# Patient Record
Sex: Female | Born: 1995 | Race: White | Hispanic: No | Marital: Single | Smoking: Never smoker
Health system: Southern US, Community
[De-identification: ages and names within clinical notes are randomized; demographics above are authoritative.]

## PROBLEM LIST (undated history)

## (undated) DIAGNOSIS — N83209 Unspecified ovarian cyst, unspecified side: Secondary | ICD-10-CM

## (undated) HISTORY — PX: KNEE SURGERY: SHX244

---

## 2017-01-24 ENCOUNTER — Encounter: Payer: Self-pay | Admitting: Emergency Medicine

## 2017-01-24 ENCOUNTER — Emergency Department
Admission: EM | Admit: 2017-01-24 | Discharge: 2017-01-24 | Disposition: A | Discharge status: Home / Self Care | Payer: BLUE CROSS/BLUE SHIELD | Attending: Emergency Medicine | Admitting: Emergency Medicine

## 2017-01-24 ENCOUNTER — Emergency Department: Payer: BLUE CROSS/BLUE SHIELD

## 2017-01-24 ENCOUNTER — Other Ambulatory Visit: Payer: Self-pay

## 2017-01-24 DIAGNOSIS — R102 Pelvic and perineal pain: Secondary | ICD-10-CM | POA: Diagnosis not present

## 2017-01-24 DIAGNOSIS — J029 Acute pharyngitis, unspecified: Secondary | ICD-10-CM | POA: Insufficient documentation

## 2017-01-24 DIAGNOSIS — Z975 Presence of (intrauterine) contraceptive device: Secondary | ICD-10-CM | POA: Diagnosis not present

## 2017-01-24 DIAGNOSIS — N760 Acute vaginitis: Secondary | ICD-10-CM | POA: Insufficient documentation

## 2017-01-24 DIAGNOSIS — B9689 Other specified bacterial agents as the cause of diseases classified elsewhere: Secondary | ICD-10-CM

## 2017-01-24 DIAGNOSIS — N938 Other specified abnormal uterine and vaginal bleeding: Secondary | ICD-10-CM | POA: Diagnosis present

## 2017-01-24 DIAGNOSIS — N8312 Corpus luteum cyst of left ovary: Secondary | ICD-10-CM | POA: Insufficient documentation

## 2017-01-24 HISTORY — DX: Unspecified ovarian cyst, unspecified side: N83.209

## 2017-01-24 LAB — CBC WITH DIFFERENTIAL/PLATELET
BASOS ABS: 0 10*3/uL (ref 0–0.1)
Basophils Relative: 0 %
Eosinophils Absolute: 0.1 10*3/uL (ref 0–0.7)
Eosinophils Relative: 1 %
HEMATOCRIT: 42.3 % (ref 35.0–47.0)
HEMOGLOBIN: 14.2 g/dL (ref 12.0–16.0)
LYMPHS PCT: 18 %
Lymphs Abs: 1.2 10*3/uL (ref 1.0–3.6)
MCH: 29.8 pg (ref 26.0–34.0)
MCHC: 33.7 g/dL (ref 32.0–36.0)
MCV: 88.3 fL (ref 80.0–100.0)
MONO ABS: 0.4 10*3/uL (ref 0.2–0.9)
MONOS PCT: 6 %
NEUTROS ABS: 5.2 10*3/uL (ref 1.4–6.5)
Neutrophils Relative %: 75 %
PLATELETS: 228 10*3/uL (ref 150–440)
RBC: 4.79 MIL/uL (ref 3.80–5.20)
RDW: 13.4 % (ref 11.5–14.5)
WBC: 6.9 10*3/uL (ref 3.6–11.0)

## 2017-01-24 LAB — WET PREP, GENITAL
SPERM: NONE SEEN
TRICH WET PREP: NONE SEEN
YEAST WET PREP: NONE SEEN

## 2017-01-24 LAB — BASIC METABOLIC PANEL
ANION GAP: 9 (ref 5–15)
BUN: 13 mg/dL (ref 6–20)
CHLORIDE: 103 mmol/L (ref 101–111)
CO2: 27 mmol/L (ref 22–32)
Calcium: 9.8 mg/dL (ref 8.9–10.3)
Creatinine, Ser: 0.79 mg/dL (ref 0.44–1.00)
GFR calc Af Amer: >60 mL/min (ref >60)
GLUCOSE: 113 mg/dL — AB (ref 65–99)
POTASSIUM: 3.8 mmol/L (ref 3.5–5.1)
Sodium: 139 mmol/L (ref 135–145)

## 2017-01-24 LAB — CHLAMYDIA/NGC RT PCR (ARMC ONLY)
Chlamydia Tr: NOT DETECTED
N gonorrhoeae: NOT DETECTED

## 2017-01-24 LAB — URINALYSIS, COMPLETE (UACMP) WITH MICROSCOPIC
Bacteria, UA: NONE SEEN
Bilirubin Urine: NEGATIVE
GLUCOSE, UA: NEGATIVE mg/dL
KETONES UR: NEGATIVE mg/dL
LEUKOCYTES UA: NEGATIVE
Nitrite: NEGATIVE
PH: 6 (ref 5.0–8.0)
Protein, ur: NEGATIVE mg/dL
SPECIFIC GRAVITY, URINE: 1.02 (ref 1.005–1.030)

## 2017-01-24 LAB — POCT PREGNANCY, URINE: Preg Test, Ur: NEGATIVE

## 2017-01-24 MED ORDER — METRONIDAZOLE 500 MG PO TABS: 500.0000 mg | ORAL_TABLET | Freq: Two times a day (BID) | ORAL | 0 refills | Status: AC

## 2017-01-24 NOTE — Discharge Instructions (Signed)
Keeping your  appointment with your doctor as scheduled. Flagyl 500 mg twice a day for 7 days. Do not drink any alcohol while taking this medication and for 72 hours after finishing it. You may also take Tylenol or Aleve if needed for pain.

## 2017-01-24 NOTE — ED Notes (Signed)
Pt has hx of cyst. Pt has IUD that was placed over a mth ago and has been bleeding since then. Pt has sore throat believes she has mono bc her friend has it. Pt states pain so bad she almost passes out from it. Awaiting EDP.

## 2017-01-24 NOTE — ED Provider Notes (Signed)
[facility] Emergency Department Provider Note  ____________________________________________  Time seen: Approximately 2:08 PM  I have reviewed the triage vital signs and the nursing notes.   HISTORY  Chief Complaint Vaginal Bleeding and Sore Throat    HPI Jacqueline Small is a 21 y.o. female who presents to the emergency department for evaluation of vaginal bleeding and sore throat. She has had vaginal bleeding since her IUD insertion on December 19, 2016. She uses approximately 3 tampons per day. She denies abdominal pain, cramping, or dysuria. Also, she has been exposed to mono and has felt tired, had a sore throat, sinus pressure and nasal congestion for the for the past couple of weeks. She has been on antibiotics, but hasn't gotten any better. She denies fever. She has no chronic medical conditions, she takes no medications on a daily basis, and she has no known drug allergies.  Past Medical History:  Diagnosis Date  . Ovarian cyst     There are no active problems to display for this patient.   Past Surgical History:  Procedure Laterality Date  . KNEE SURGERY Right     Prior to Admission medications   Medication Sig Start Date End Date Taking? Authorizing Provider  metroNIDAZOLE (FLAGYL) 500 MG tablet Take 1 tablet (500 mg total) 2 (two) times daily by mouth. 01/24/17   Tommi RumpsSummers, Rhonda L, PA-C    Allergies Patient has no known allergies.  No family history on file.  Social History Social History   Tobacco Use  . Smoking status: Never Smoker  . Smokeless tobacco: Never Used  Substance Use Topics  . Alcohol use: Yes    Comment: occ  . Drug use: Yes    Types: Marijuana    Review of Systems Constitutional: Negative for fever. Eyes: No visual changes. ENT: Positive for sore throat; negative for difficulty swallowing. Respiratory: Denies shortness of breath. Gastrointestinal: No abdominal pain.  No nausea, no vomiting.  No diarrhea. Positive  for vaginal bleeding. Genitourinary: Negative for dysuria. Musculoskeletal: Negative for generalized body aches. Skin: Negative for rash. Neurological: negative for headaches, negative  focal weakness or numbness.  ____________________________________________   PHYSICAL EXAM:  VITAL SIGNS: ED Triage Vitals [01/24/17 1241]  Enc Vitals Group     BP (!) 137/97     Pulse Rate 98     Resp      Temp 98.4 F (36.9 C)     Temp Source Oral     SpO2 99 %     Weight 160 lb (72.6 kg)     Height 5\' 8"  (1.727 m)     Head Circumference      Peak Flow      Pain Score      Pain Loc      Pain Edu?      Excl. in GC?    Constitutional: Alert and oriented. Well appearing and in no acute distress. Eyes: Conjunctivae are normal.  Head: Atraumatic. Nose: No congestion/rhinnorhea. Mouth/Throat: Mucous membranes are moist.  Oropharynx mildly erythematus, tonsils not visualized and without exudate. Uvula is midline. Neck: No stridor.  Lymphatic: Lymphadenopathy: no cervical or mental nodes palpable Cardiovascular: Normal rate, regular rhythm. Good peripheral circulation. Respiratory: Normal respiratory effort. Lungs CTAB. Gastrointestinal: Soft and nontender. Genitourinary: Pelvic exam: IUD strings identified. No active bleeding. Cervix is normal in appearance. Thin, grey-ish discharge present. Mild adnexal tenderness on the right. Musculoskeletal: No lower extremity tenderness nor edema.  Neurologic:  Normal speech and language. No gross focal neurologic  deficits are appreciated. Speech is normal. No gait instability. Skin:  Skin is warm, dry and intact. No rash noted Psychiatric: Mood and affect are normal. Speech and behavior are normal.  ____________________________________________   LABS (all labs ordered are listed, but only abnormal results are displayed)  Labs Reviewed  WET PREP, GENITAL - Abnormal; Notable for the following components:      Result Value   Clue Cells Wet Prep HPF  POC PRESENT (*)    WBC, Wet Prep HPF POC RARE (*)    All other components within normal limits  BASIC METABOLIC PANEL - Abnormal; Notable for the following components:   Glucose, Bld 113 (*)    All other components within normal limits  URINALYSIS, COMPLETE (UACMP) WITH MICROSCOPIC - Abnormal; Notable for the following components:   Color, Urine YELLOW (*)    APPearance HAZY (*)    Hgb urine dipstick MODERATE (*)    Squamous Epithelial / LPF 0-5 (*)    All other components within normal limits  CHLAMYDIA/NGC RT PCR ([facility] ONLY)  CBC WITH DIFFERENTIAL/PLATELET  POC URINE PREG, ED  POCT PREGNANCY, URINE   ____________________________________________  EKG  Not indicated ____________________________________________  RADIOLOGY  Pelvic Ultrasound: IMPRESSION: 1. IUD in appropriate position within the endometrial cavity. No complication. 2. Probable small left ovarian corpus luteal cyst with small volume free pelvic fluid. 3. Otherwise unremarkable pelvic ultrasound. No other acute abnormality identified. ____________________________________________   PROCEDURES  Procedure(s) performed: Pelvic exam  Critical Care performed: No ____________________________________________   INITIAL IMPRESSION / ASSESSMENT AND PLAN / ED COURSE  21 year old female presenting to the emergency department for evaluation of vaginal bleeding and sore throat. Vaginal bleeding has been consistent since insertion of IUD in October. She has an appointment with her GYN in a couple of weeks, but was advised to come to the ER today as she is an [employer] student and her GYN is not local. Pelvic ultrasound reveals a small ovarian cyst on the left with small amount of free fluid, otherwise normal. URI symptoms/sore throat exam is inconsistent with mononucleosis and this was discussed with the patient who will treat her symptoms with over-the-counter medications. Wet prep and GC/Chlamydia results are  pending. Bridget Hartshornhonda Summers, PA-C will await results and discuss with the patient then make disposition.   Pertinent labs & imaging results that were available during my care of the patient were reviewed by me and considered in my medical decision making (see chart for details). ____________________________________________  This SmartLink is deprecated. Use AVSMEDLIST instead to display the medication list for a patient.  FINAL CLINICAL IMPRESSION(S) / ED DIAGNOSES  Final diagnoses:  Pelvic pain  BV (bacterial vaginosis)  Corpus luteum cyst of left ovary    If controlled substance prescribed during this visit, 12 month history viewed on the [facility] prior to issuing an initial prescription for Schedule II or III opiod.   Note:  This document was prepared using Dragon voice recognition software and may include unintentional dictation errors.    Chinita Pesterriplett, Raechal Raben B, FNP 01/25/17 [id number]    Merrily Brittleifenbark, Neil, MD 01/27/17 [phone]

## 2017-01-24 NOTE — ED Triage Notes (Signed)
Pt to ED via POV for c/o vaginal bleeding since October 6th and sore throat for the past 2 weeks. Pt states that she has hx/o ovarian cyst. Pt has been on antibiotics for URI symptoms but doesn't feel like she is getting any better. Pts friend was recently diagnosed with Mono and pt thinks she may have mono. Pt in NAD at this time.

## 2017-01-26 LAB — POCT PREGNANCY, URINE: PREG TEST UR: NEGATIVE

## 2017-01-28 ENCOUNTER — Encounter: Payer: Self-pay | Admitting: Emergency Medicine

## 2017-01-28 ENCOUNTER — Emergency Department: Payer: BLUE CROSS/BLUE SHIELD

## 2017-01-28 ENCOUNTER — Emergency Department
Admission: EM | Admit: 2017-01-28 | Discharge: 2017-01-28 | Disposition: A | Discharge status: Home / Self Care | Payer: BLUE CROSS/BLUE SHIELD | Attending: Emergency Medicine | Admitting: Emergency Medicine

## 2017-01-28 DIAGNOSIS — J189 Pneumonia, unspecified organism: Secondary | ICD-10-CM

## 2017-01-28 DIAGNOSIS — J181 Lobar pneumonia, unspecified organism: Secondary | ICD-10-CM | POA: Diagnosis not present

## 2017-01-28 DIAGNOSIS — R0602 Shortness of breath: Secondary | ICD-10-CM | POA: Diagnosis present

## 2017-01-28 LAB — BASIC METABOLIC PANEL
ANION GAP: 9 (ref 5–15)
BUN: 14 mg/dL (ref 6–20)
CO2: 26 mmol/L (ref 22–32)
Calcium: 9.7 mg/dL (ref 8.9–10.3)
Chloride: 106 mmol/L (ref 101–111)
Creatinine, Ser: 0.87 mg/dL (ref 0.44–1.00)
GFR calc Af Amer: >60 mL/min (ref >60)
Glucose, Bld: 103 mg/dL — ABNORMAL HIGH (ref 65–99)
POTASSIUM: 3.4 mmol/L — AB (ref 3.5–5.1)
SODIUM: 141 mmol/L (ref 135–145)

## 2017-01-28 LAB — CBC
HEMATOCRIT: 41.5 % (ref 35.0–47.0)
HEMOGLOBIN: 14.2 g/dL (ref 12.0–16.0)
MCH: 29.8 pg (ref 26.0–34.0)
MCHC: 34.1 g/dL (ref 32.0–36.0)
MCV: 87.4 fL (ref 80.0–100.0)
Platelets: 215 10*3/uL (ref 150–440)
RBC: 4.75 MIL/uL (ref 3.80–5.20)
RDW: 13.6 % (ref 11.5–14.5)
WBC: 6.9 10*3/uL (ref 3.6–11.0)

## 2017-01-28 LAB — TROPONIN I: Troponin I: <0.03 ng/mL (ref <0.03)

## 2017-01-28 MED ORDER — IPRATROPIUM-ALBUTEROL 0.5-2.5 (3) MG/3ML IN SOLN
3.0000 mL | Freq: Once | RESPIRATORY_TRACT | Status: AC
  Administered 2017-01-28: 3 mL via RESPIRATORY_TRACT
  Filled 2017-01-28: qty 3

## 2017-01-28 MED ORDER — ALBUTEROL SULFATE HFA 108 (90 BASE) MCG/ACT IN AERS: 2.0000 | INHALATION_SPRAY | Freq: Four times a day (QID) | RESPIRATORY_TRACT | 2 refills | Status: AC | PRN

## 2017-01-28 MED ORDER — AZITHROMYCIN 250 MG PO TABS: ORAL_TABLET | ORAL | 0 refills | Status: AC

## 2017-01-28 MED ORDER — AZITHROMYCIN 500 MG PO TABS
500.0000 mg | ORAL_TABLET | Freq: Once | ORAL | Status: AC
  Administered 2017-01-28: 500 mg via ORAL
  Filled 2017-01-28: qty 1

## 2017-01-28 NOTE — Discharge Instructions (Signed)

## 2017-01-28 NOTE — ED Provider Notes (Signed)
[facility] Emergency Department Provider Note  ____________________________________________  Time seen: Approximately 7:38 AM  I have reviewed the triage vital signs and the nursing notes.   HISTORY  Chief Complaint Shortness of Breath and Chest Pain   HPI Jacqueline Small is a 21 y.o. female no significant past medical history who presents for evaluation of chest pain and shortness of breath. Patient reports 1 week of dry cough. Since yesterday had subjective fevers and wheezing. this morning complaining of chest tightness and mild sob. No prior history of asthma or COPD. Patient is not a smoker. She reports that her chest tightness is mild, constant, and located diffuse across her chest and nonradiating. No pleuritic sharp pain, no leg pain or swelling, no hemoptysis, no family history blood clots. No abdominal pain, no nausea or vomiting. Patient reports that 2 weeks ago she was put on amoxicillin for strep.  Past Medical History:  Diagnosis Date  . Ovarian cyst     There are no active problems to display for this patient.   Past Surgical History:  Procedure Laterality Date  . KNEE SURGERY Right     Prior to Admission medications   Medication Sig Start Date End Date Taking? Authorizing Provider  albuterol (PROVENTIL HFA;VENTOLIN HFA) 108 (90 Base) MCG/ACT inhaler Inhale 2 puffs every 6 (six) hours as needed into the lungs for wheezing or shortness of breath. 01/28/17   Nita SickleVeronese, [place], MD  azithromycin [facility](ZITHROMAX) 250 MG tablet Take 1 a day for 4 days 01/28/17   Don PerkingVeronese, [place], MD  metroNIDAZOLE (FLAGYL) 500 MG tablet Take 1 tablet (500 mg total) 2 (two) times daily by mouth. 01/24/17   Tommi RumpsSummers, Rhonda L, PA-C    Allergies Patient has no known allergies.  History reviewed. No pertinent family history.  Social History Social History   Tobacco Use  . Smoking status: Never Smoker  . Smokeless tobacco: Never Used  Substance Use Topics  .  Alcohol use: Yes    Comment: occ  . Drug use: Yes    Types: Marijuana    Review of Systems  Constitutional: + subjective fever. Eyes: Negative for visual changes. ENT: Negative for sore throat. Neck: No neck pain  Cardiovascular: + chest tightness. Respiratory: + shortness of breath, cough, wheezing Gastrointestinal: Negative for abdominal pain, vomiting or diarrhea. Genitourinary: Negative for dysuria. Musculoskeletal: Negative for back pain. Skin: Negative for rash. Neurological: Negative for headaches, weakness or numbness. Psych: No SI or HI  ____________________________________________   PHYSICAL EXAM:  VITAL SIGNS: ED Triage Vitals [01/28/17 0602]  Enc Vitals Group     BP (!) 118/96     Pulse Rate 89     Resp 18     Temp 98.8 F (37.1 C)     Temp Source Oral     SpO2 100 %     Weight 160 lb (72.6 kg)     Height      Head Circumference      Peak Flow      Pain Score      Pain Loc      Pain Edu?      Excl. in GC?     Constitutional: Alert and oriented. Well appearing and in no apparent distress. HEENT:      Head: Normocephalic and atraumatic.         Eyes: Conjunctivae are normal. Sclera is non-icteric.       Mouth/Throat: Mucous membranes are moist.       Neck: Supple  with no signs of meningismus. Cardiovascular: Regular rate and rhythm. No murmurs, gallops, or rubs. 2+ symmetrical distal pulses are present in all extremities. No JVD. Respiratory: Normal respiratory effort. Lungs are clear to auscultation bilaterally with decrease air movement bilaterally. No wheezes, crackles, or rhonchi.  Gastrointestinal: Soft, non tender, and non distended with positive bowel sounds. No rebound or guarding. Musculoskeletal: Nontender with normal range of motion in all extremities. No edema, cyanosis, or erythema of extremities. Neurologic: Normal speech and language. Face is symmetric. Moving all extremities. No gross focal neurologic deficits are appreciated. Skin:  Skin is warm, dry and intact. No rash noted. Psychiatric: Mood and affect are normal. Speech and behavior are normal.  ____________________________________________   LABS (all labs ordered are listed, but only abnormal results are displayed)  Labs Reviewed  BASIC METABOLIC PANEL - Abnormal; Notable for the following components:      Result Value   Potassium 3.4 (*)    Glucose, Bld 103 (*)    All other components within normal limits  CBC  TROPONIN I   ____________________________________________  EKG  ED ECG REPORT I, Nita Sicklearolina Kailin Leu, the attending physician, personally viewed and interpreted this ECG.  Normal sinus rhythm, rate of 83, normal intervals, normal axis, no ST elevations or depressions. ____________________________________________  RADIOLOGY  CXR:  Apparent mild left basilar opacity is less well characterized on the lateral view but could reflect mild infection. ____________________________________________   PROCEDURES  Procedure(s) performed: None Procedures Critical Care performed:  None ____________________________________________   INITIAL IMPRESSION / ASSESSMENT AND PLAN / ED COURSE  21 y.o. female no significant past medical history who presents for evaluation of chest pain and shortness of breath since this morning in the setting 1 week of cough and intermittent wheezing. Patient is well-appearing, in no distress, normal vital signs, normal work of breathing, afebrile, no tachycardia or tachypnea, lungs are clear although decreased air movement bilaterally with no crackles or wheezes. We'll give him 1 DuoNeb. Chest x-ray concerning for left lower lobe infiltrate. We'll start patient on Z-Pak.     _________________________ 8:15 AM on 01/28/2017 -----------------------------------------  Patient moving great air after one duoneb. Reports resolution of chest tightness and SOB. Labs WNL. Remains well appearing with normal vital signs. At this time  with normal vital signs, normal exam, normal labs I feel the patient is safe for discharge and continue management as an outpatient. Recommended close follow-up with primary care doctor. We'll provide patient with an albuterol inhaler and azithromycin which she was started in the emergency room. Discussed customary and standard return precautions.   As part of my medical decision making, I reviewed the following data within the electronic MEDICAL RECORD NUMBER Nursing notes reviewed and incorporated, Labs reviewed , EKG interpreted , Radiograph reviewed , Notes from prior ED visits and [employer] Controlled Substance Database    Pertinent labs & imaging results that were available during my care of the patient were reviewed by me and considered in my medical decision making (see chart for details).    ____________________________________________   FINAL CLINICAL IMPRESSION(S) / ED DIAGNOSES  Final diagnoses:  Community acquired pneumonia of left lower lobe of lung (HCC)      NEW MEDICATIONS STARTED DURING THIS VISIT:  This SmartLink is deprecated. Use AVSMEDLIST instead to display the medication list for a patient.   Note:  This document was prepared using Dragon voice recognition software and may include unintentional dictation errors.    Nita SickleVeronese, [place], MD 01/28/17 [phone]

## 2017-01-28 NOTE — ED Notes (Signed)
AAOx3.  Skin warm and dry.  NAD 

## 2017-01-28 NOTE — ED Triage Notes (Signed)
Pt c/o central chest pain that woke pt this AM. Pt has experienced cough, nasal and chest congestion x3 weeks. Pt has been taking 2 types of antibiotics but is unaware of what they are. Pt is A&O x4 in triage, dry cough present with auditory wheezing.

## 2017-02-09 ENCOUNTER — Encounter: Payer: Self-pay | Admitting: Emergency Medicine

## 2017-02-09 ENCOUNTER — Emergency Department: Payer: BLUE CROSS/BLUE SHIELD

## 2017-02-09 ENCOUNTER — Other Ambulatory Visit: Payer: Self-pay

## 2017-02-09 ENCOUNTER — Emergency Department
Admission: EM | Admit: 2017-02-09 | Discharge: 2017-02-09 | Disposition: A | Discharge status: Home / Self Care | Payer: BLUE CROSS/BLUE SHIELD | Attending: Emergency Medicine | Admitting: Emergency Medicine

## 2017-02-09 DIAGNOSIS — R109 Unspecified abdominal pain: Secondary | ICD-10-CM | POA: Diagnosis present

## 2017-02-09 DIAGNOSIS — G43909 Migraine, unspecified, not intractable, without status migrainosus: Secondary | ICD-10-CM | POA: Diagnosis not present

## 2017-02-09 LAB — URINALYSIS, ROUTINE W REFLEX MICROSCOPIC
Bilirubin Urine: NEGATIVE
Glucose, UA: NEGATIVE mg/dL
Hgb urine dipstick: NEGATIVE
Ketones, ur: NEGATIVE mg/dL
LEUKOCYTES UA: NEGATIVE
NITRITE: NEGATIVE
Protein, ur: NEGATIVE mg/dL
SPECIFIC GRAVITY, URINE: 1.011 (ref 1.005–1.030)
pH: 5 (ref 5.0–8.0)

## 2017-02-09 LAB — CBC
HEMATOCRIT: 39 % (ref 35.0–47.0)
HEMOGLOBIN: 13.5 g/dL (ref 12.0–16.0)
MCH: 30.2 pg (ref 26.0–34.0)
MCHC: 34.6 g/dL (ref 32.0–36.0)
MCV: 87.2 fL (ref 80.0–100.0)
PLATELETS: 220 10*3/uL (ref 150–440)
RBC: 4.47 MIL/uL (ref 3.80–5.20)
RDW: 13.3 % (ref 11.5–14.5)
WBC: 7.6 10*3/uL (ref 3.6–11.0)

## 2017-02-09 LAB — COMPREHENSIVE METABOLIC PANEL
ALT: 15 U/L (ref 14–54)
ANION GAP: 10 (ref 5–15)
AST: 18 U/L (ref 15–41)
Albumin: 4.3 g/dL (ref 3.5–5.0)
Alkaline Phosphatase: 52 U/L (ref 38–126)
BUN: 14 mg/dL (ref 6–20)
CHLORIDE: 105 mmol/L (ref 101–111)
CO2: 24 mmol/L (ref 22–32)
Calcium: 9.4 mg/dL (ref 8.9–10.3)
Creatinine, Ser: 0.87 mg/dL (ref 0.44–1.00)
GFR calc non Af Amer: >60 mL/min (ref >60)
Glucose, Bld: 101 mg/dL — ABNORMAL HIGH (ref 65–99)
Potassium: 3.6 mmol/L (ref 3.5–5.1)
SODIUM: 139 mmol/L (ref 135–145)
Total Bilirubin: 0.6 mg/dL (ref 0.3–1.2)
Total Protein: 7.3 g/dL (ref 6.5–8.1)

## 2017-02-09 LAB — LIPASE, BLOOD: Lipase: 20 U/L (ref 11–51)

## 2017-02-09 LAB — POCT PREGNANCY, URINE: PREG TEST UR: NEGATIVE

## 2017-02-09 MED ORDER — SODIUM CHLORIDE 0.9 % IV BOLUS (SEPSIS)
1000.0000 mL | Freq: Once | INTRAVENOUS | Status: AC
  Administered 2017-02-09: 1000 mL via INTRAVENOUS

## 2017-02-09 MED ORDER — DIPHENHYDRAMINE HCL 50 MG/ML IJ SOLN
50.0000 mg | Freq: Once | INTRAMUSCULAR | Status: AC
  Administered 2017-02-09: 50 mg via INTRAVENOUS
  Filled 2017-02-09: qty 1

## 2017-02-09 MED ORDER — KETOROLAC TROMETHAMINE 30 MG/ML IJ SOLN
30.0000 mg | Freq: Once | INTRAMUSCULAR | Status: AC
  Administered 2017-02-09: 30 mg via INTRAVENOUS
  Filled 2017-02-09: qty 1

## 2017-02-09 MED ORDER — ONDANSETRON HCL 4 MG/2ML IJ SOLN
INTRAMUSCULAR | Status: AC
  Filled 2017-02-09: qty 2

## 2017-02-09 MED ORDER — METOCLOPRAMIDE HCL 5 MG/ML IJ SOLN
10.0000 mg | Freq: Once | INTRAMUSCULAR | Status: AC
  Administered 2017-02-09: 10 mg via INTRAVENOUS
  Filled 2017-02-09: qty 2

## 2017-02-09 MED ORDER — DICYCLOMINE HCL 20 MG PO TABS: 20.0000 mg | ORAL_TABLET | Freq: Three times a day (TID) | ORAL | 0 refills | Status: AC | PRN

## 2017-02-09 NOTE — ED Triage Notes (Addendum)
Patient ambulatory to triage with steady gait, without difficulty or distress noted; pt reports frontal migraine since last Tuesday; denies hx of same; also c/o right flank and right lower abd pain tonight with no accomp symptoms; imitrex taken PTA with some relief

## 2017-02-09 NOTE — ED Provider Notes (Signed)
[facility] Emergency Department Provider Note  Time seen: 4:48 AM  I have reviewed the triage vital signs and the nursing notes.   HISTORY  Chief Complaint Migraine and Abdominal Pain    HPI Clement SayresZoe Rudie MeyerKurtz is a 21 y.o. female with a past medical history of ovarian cyst presents to the emergency department for significant headache as well as right flank pain.  According to the patient for the past 1 week she has been experiencing a headache.  She states at times it will go away but for the most part it is present.  Denies any history of migraines previously.  Denies any fever.  Denies any weakness numbness difficulty speaking or thinking.  Patient also states for the past 4 days she has intermittently been experiencing right flank pain which she states has gotten somewhat worse.  Denies hematuria or dysuria.  Patient states irregular periods but denies vaginal discharge.   Past Medical History:  Diagnosis Date  . Ovarian cyst     There are no active problems to display for this patient.   Past Surgical History:  Procedure Laterality Date  . KNEE SURGERY Right     Prior to Admission medications   Medication Sig Start Date End Date Taking? Authorizing Provider  albuterol (PROVENTIL HFA;VENTOLIN HFA) 108 (90 Base) MCG/ACT inhaler Inhale 2 puffs every 6 (six) hours as needed into the lungs for wheezing or shortness of breath. 01/28/17   Nita SickleVeronese, [place], MD  azithromycin [facility](ZITHROMAX) 250 MG tablet Take 1 a day for 4 days 01/28/17   Don PerkingVeronese, [place], MD  metroNIDAZOLE (FLAGYL) 500 MG tablet Take 1 tablet (500 mg total) 2 (two) times daily by mouth. 01/24/17   Tommi RumpsSummers, Rhonda L, PA-C    No Known Allergies  No family history on file.  Social History Social History   Tobacco Use  . Smoking status: Never Smoker  . Smokeless tobacco: Never Used  Substance Use Topics  . Alcohol use: Yes    Comment: occ  . Drug use: Yes    Types: Marijuana    Review of  Systems Constitutional: Negative for fever. Cardiovascular: Negative for chest pain. Respiratory: Negative for shortness of breath. Gastrointestinal: Right flank pain.  Negative for nausea or vomiting.  Negative for diarrhea. Genitourinary: Negative for dysuria.  Negative for hematuria. Musculoskeletal: Negative for back pain Neurological: Significant headache but denies focal weakness or numbness. All other ROS negative  ____________________________________________   PHYSICAL EXAM:  VITAL SIGNS: ED Triage Vitals  Enc Vitals Group     BP 02/09/17 0418 (!) 144/106     Pulse Rate 02/09/17 0418 99     Resp 02/09/17 0418 18     Temp 02/09/17 0418 (!) 97.4 F (36.3 C)     Temp src --      SpO2 02/09/17 0418 100 %     Weight 02/09/17 0413 160 lb (72.6 kg)     Height 02/09/17 0413 5\' 8"  (1.727 m)     Head Circumference --      Peak Flow --      Pain Score 02/09/17 0412 8     Pain Loc --      Pain Edu? --      Excl. in GC? --    Constitutional: Alert and oriented. Well appearing and in no distress. Eyes: Normal exam ENT   Head: Normocephalic and atraumatic.   Mouth/Throat: Mucous membranes are moist. Cardiovascular: Normal rate, regular rhythm. No murmur Respiratory: Normal respiratory effort without tachypnea  nor retractions. Breath sounds are clear  Gastrointestinal: Soft, mild right mid tenderness to palpation, no rebound or guarding.  No distention.  No CVA tenderness. Musculoskeletal: Nontender with normal range of motion in all extremities. Neurologic:  Normal speech and language. No gross focal neurologic deficits.  Equal grip strength.  No pronator drift.  Cranial nerves intact. Skin:  Skin is warm, dry and intact.  Psychiatric: Mood and affect are normal.   ____________________________________________   RADIOLOGY  ct head Negative Renal ultrasound negative  ____________________________________________   INITIAL IMPRESSION / ASSESSMENT AND PLAN / ED  COURSE  Pertinent labs & imaging results that were available during my care of the patient were reviewed by me and considered in my medical decision making (see chart for details).  Patient presents the emergency department with complaints of 1 week of headache which she describes as severe but no history of migraines previously.  She also states several days of right flank pain which has gotten worse.  No history of kidney stones.  Patient does state a history of ovarian cyst in the past.  Differential would include migraine headache, tension headache, less likely ICH.  Differential for abdominal pain would include biliary disease, appendicitis, ovarian cyst, ureterolithiasis, intestinal pain.  We will check labs, obtain a CT scan of the head given no history of headaches and obtain an ultrasound of the kidneys to help evaluate for possible kidney stone.  Overall the patient appears extremely well, no distress.  I reviewed the patient's records including 2 recent ER visits over the past 2 weeks for various complaints, largely noncontributory to today's ER visit.  Patient states she is feeling better after medications.  Patient's workup is normal, labs are normal, renal ultrasound and CT scan of the head are normal.  I discussed with patient trial of Bentyl for her abdominal discomfort and follow-up with her primary care doctor. ____________________________________________   FINAL CLINICAL IMPRESSION(S) / ED DIAGNOSES  Headache Abdominal pain    Minna AntisPaduchowski, Maribelle Hopple, MD 02/09/17 [phone]

## 2017-02-09 NOTE — ED Notes (Signed)
Patient transported to CT 

## 2018-03-07 IMAGING — CT CT HEAD W/O CM
3 series · 15 of 47 positions shown, 18 images · non-contrast
Comparison: None.

CLINICAL DATA: Frontal headache

EXAM:
CT HEAD WITHOUT CONTRAST
TECHNIQUE: Contiguous axial images were obtained from the base of the skull
through the vertex without intravenous contrast.

[Series 3: head wo · axial · 0.43mm/px · z∈[-121,+4]mm · 9 of 30 slices shown, 12 images]
[im 3/30  brain]
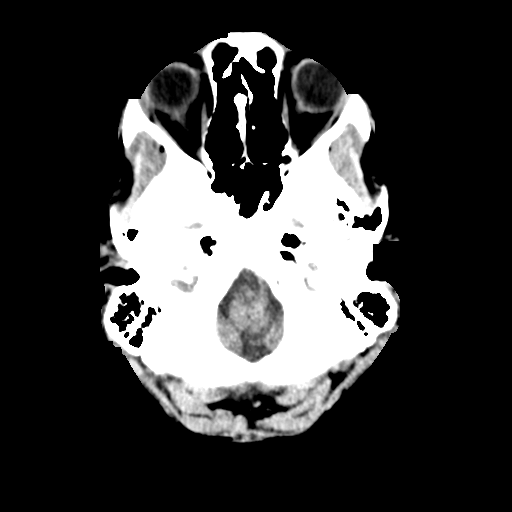
[im 3/30  bone]
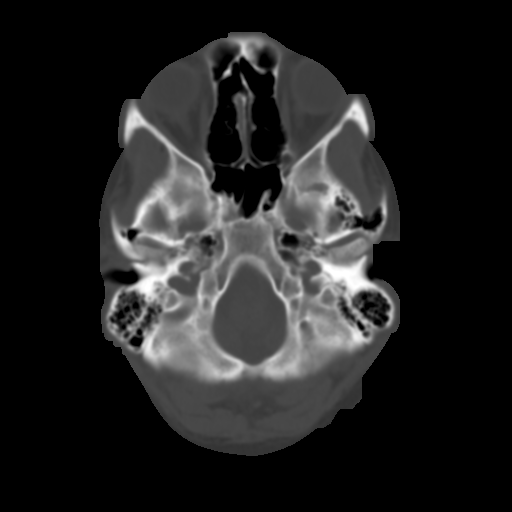
[im 6/30  brain]
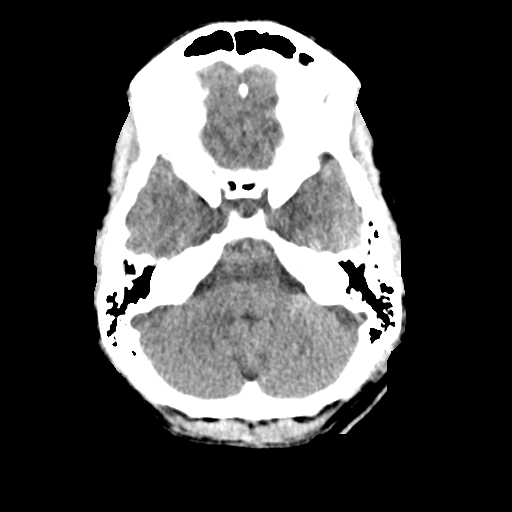
[im 9/30  brain]
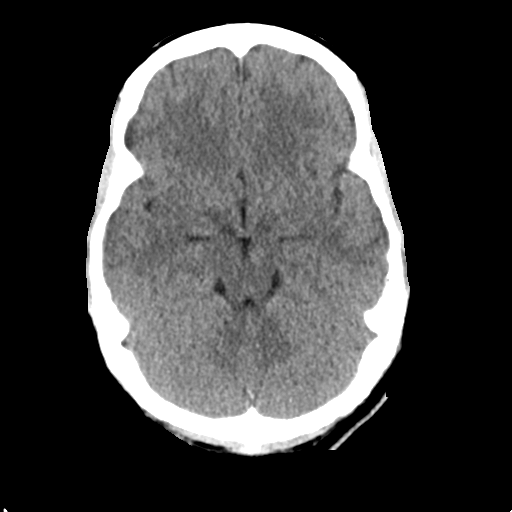
[im 12/30  brain]
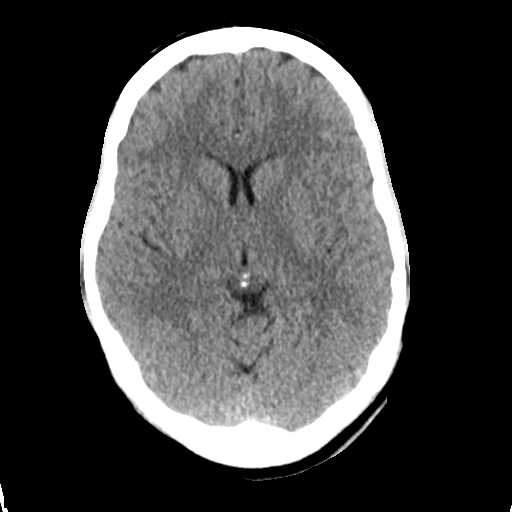
[im 16/30  brain]
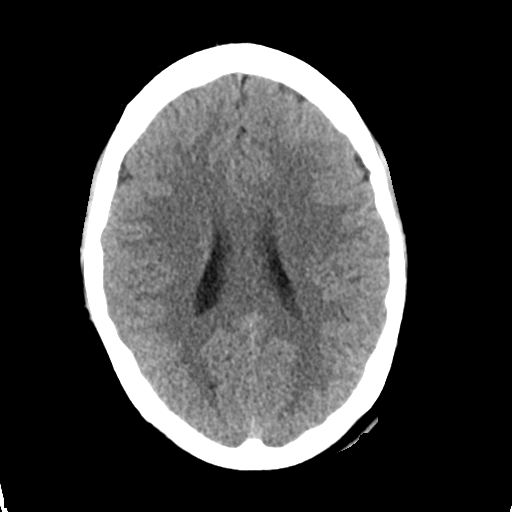
[im 16/30  bone]
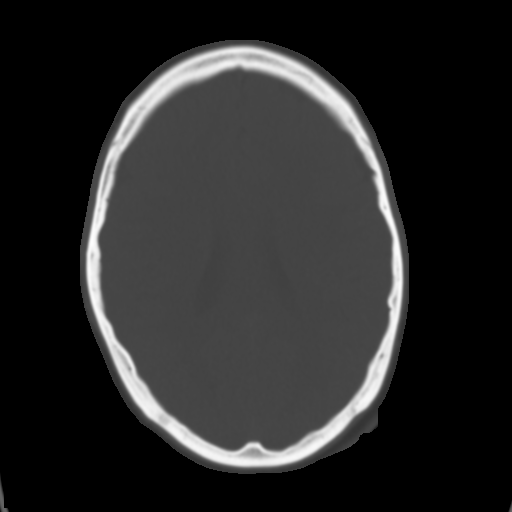
[im 19/30  brain]
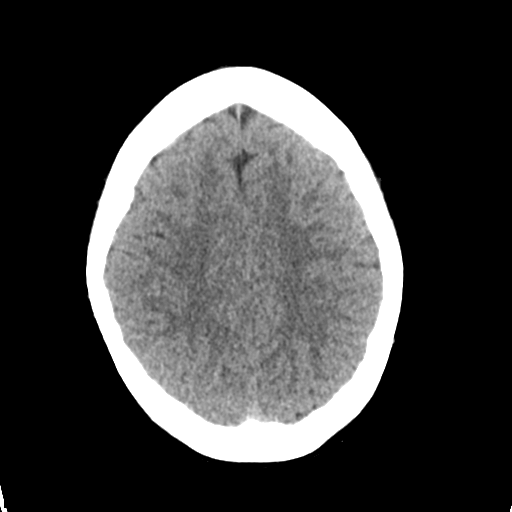
[im 22/30  brain]
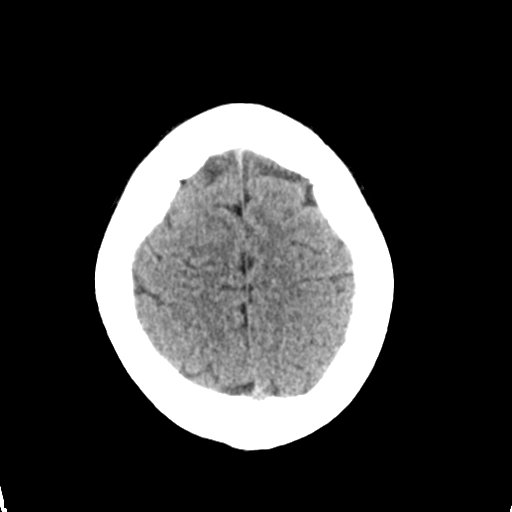
[im 25/30  brain]
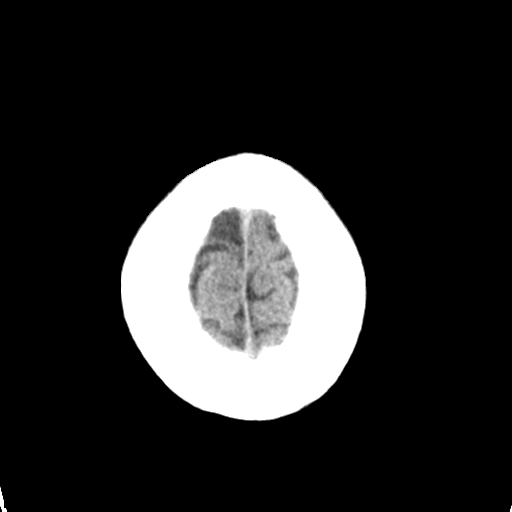
[im 28/30  brain]
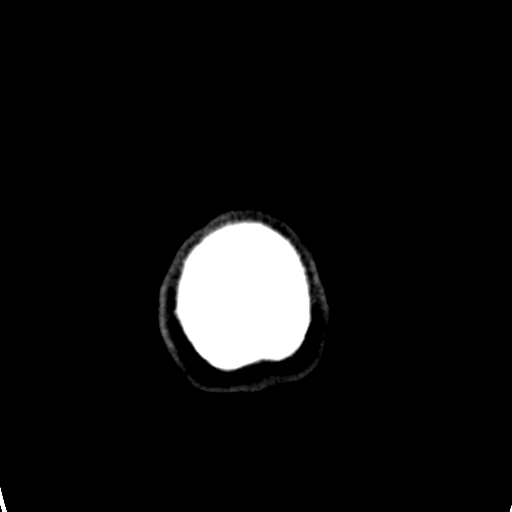
[im 28/30  bone]
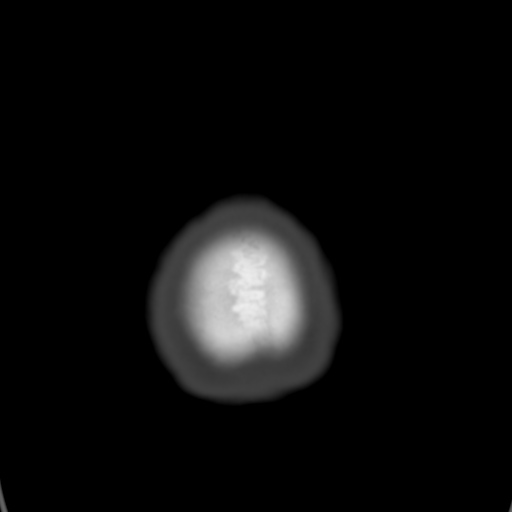

[Series 4: coronal soft tissue · coronal · 0.29mm/px · 3 of 69 slices shown]
[im 23/69  brain]
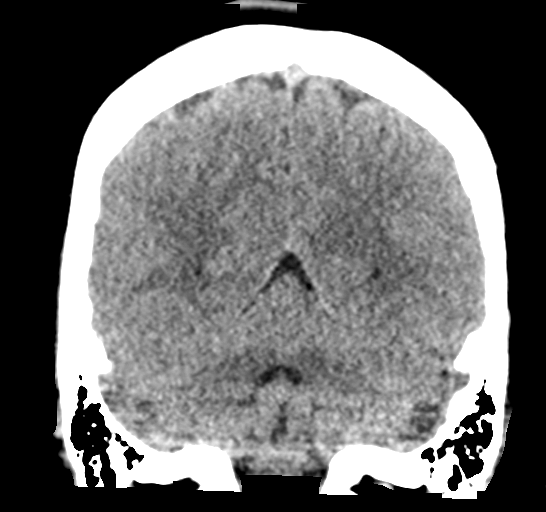
[im 31/69  brain]
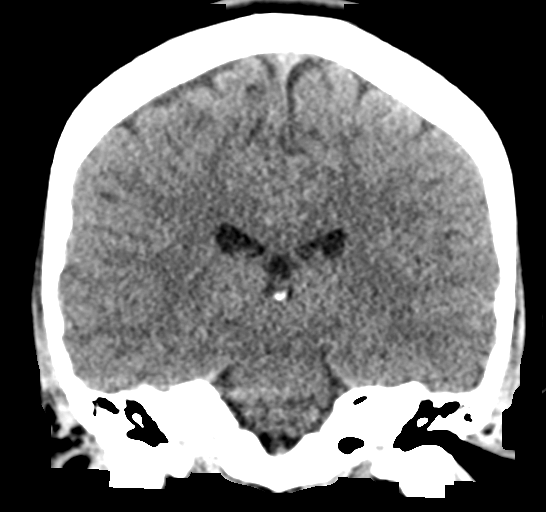
[im 38/69  brain]
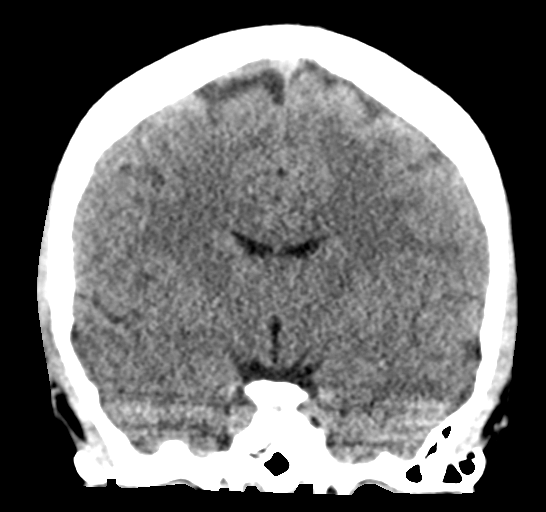

[Series 5: sagittal soft tissue · sagittal · 0.29mm/px · 3 of 54 slices shown]
[im 18/54  brain]
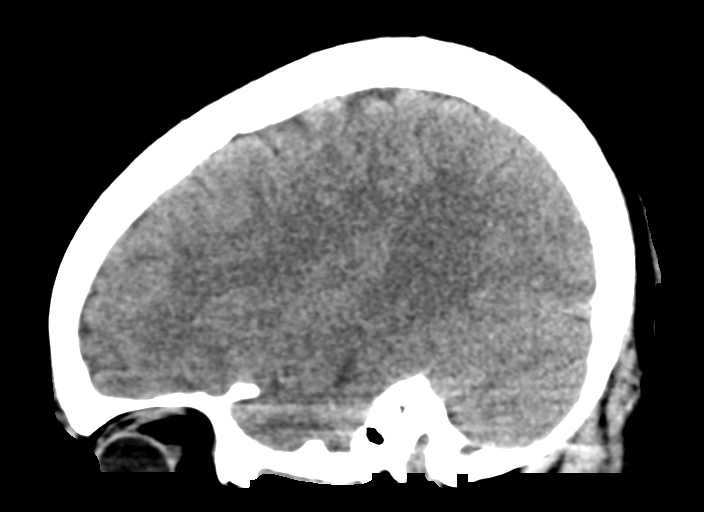
[im 27/54  brain]
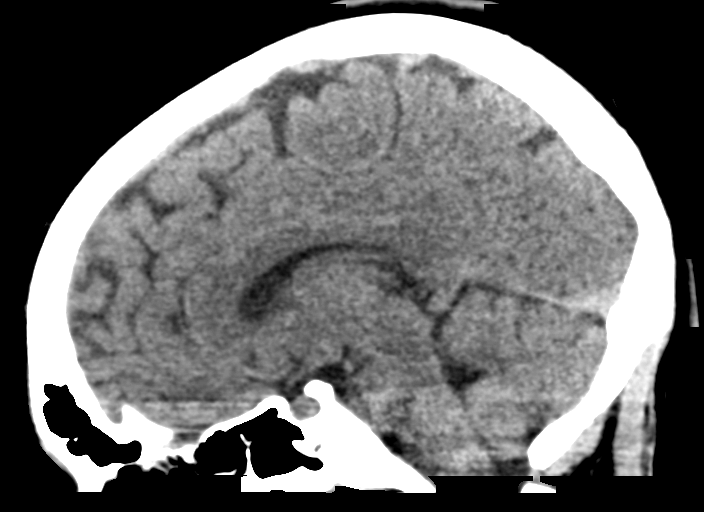
[im 36/54  brain]
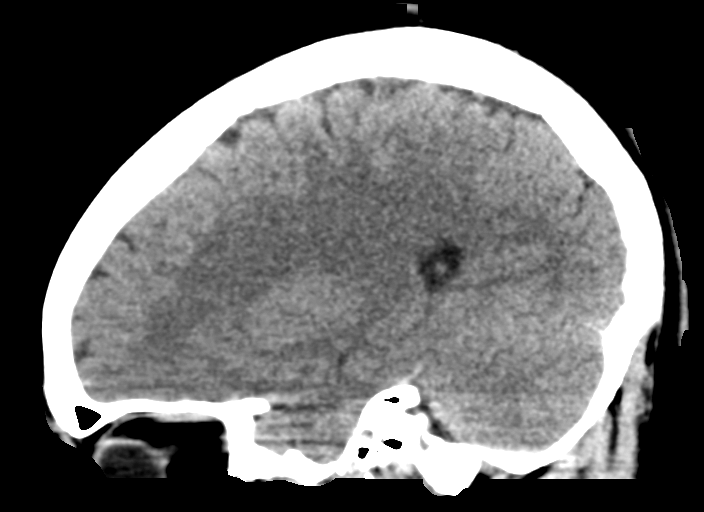

[15 of 47 positions shown; findings below may reference images not displayed]

FINDINGS: Brain: No mass lesion, intraparenchymal hemorrhage or extra-axial
collection. No evidence of acute cortical infarct. Brain parenchyma
and CSF-containing spaces are normal for age.

Vascular: No hyperdense vessel or unexpected calcification.

Skull: Normal visualized skull base, calvarium and extracranial soft
tissues.

Sinuses/Orbits: No sinus fluid levels or advanced mucosal
thickening. No mastoid effusion. Normal orbits.
IMPRESSION: Normal head CT.

## 2018-11-22 ENCOUNTER — Other Ambulatory Visit: Payer: Self-pay

## 2018-11-22 DIAGNOSIS — Z20822 Contact with and (suspected) exposure to covid-19: Secondary | ICD-10-CM

## 2018-11-24 LAB — NOVEL CORONAVIRUS, NAA: SARS-CoV-2, NAA: NOT DETECTED

## 2019-02-01 ENCOUNTER — Other Ambulatory Visit: Payer: Self-pay

## 2019-02-01 DIAGNOSIS — Z20822 Contact with and (suspected) exposure to covid-19: Secondary | ICD-10-CM

## 2019-02-03 LAB — NOVEL CORONAVIRUS, NAA: SARS-CoV-2, NAA: NOT DETECTED

## 2019-07-03 ENCOUNTER — Other Ambulatory Visit: Payer: Self-pay

## 2019-07-03 ENCOUNTER — Emergency Department
Admission: EM | Admit: 2019-07-03 | Discharge: 2019-07-03 | Disposition: A | Discharge status: Home / Self Care | Payer: BC Managed Care – PPO | Attending: Emergency Medicine | Admitting: Emergency Medicine

## 2019-07-03 ENCOUNTER — Encounter: Payer: Self-pay | Admitting: Emergency Medicine

## 2019-07-03 DIAGNOSIS — T782XXA Anaphylactic shock, unspecified, initial encounter: Secondary | ICD-10-CM | POA: Diagnosis not present

## 2019-07-03 DIAGNOSIS — L509 Urticaria, unspecified: Secondary | ICD-10-CM | POA: Diagnosis present

## 2019-07-03 MED ORDER — EPINEPHRINE 0.3 MG/0.3ML IJ SOAJ
0.3000 mg | Freq: Once | INTRAMUSCULAR | Status: AC
Start: 2019-07-03 — End: 2019-07-03
  Administered 2019-07-03: 0.3 mg via INTRAMUSCULAR

## 2019-07-03 MED ORDER — EPINEPHRINE 0.3 MG/0.3ML IJ SOAJ: 0.3000 mg | INTRAMUSCULAR | 1 refills | Status: AC | PRN

## 2019-07-03 MED ORDER — DIPHENHYDRAMINE HCL 50 MG/ML IJ SOLN
25.0000 mg | Freq: Once | INTRAMUSCULAR | Status: AC
  Administered 2019-07-03: 05:00:00 25 mg via INTRAVENOUS

## 2019-07-03 MED ORDER — METHYLPREDNISOLONE SODIUM SUCC 125 MG IJ SOLR
125.0000 mg | Freq: Once | INTRAMUSCULAR | Status: AC
  Administered 2019-07-03: 125 mg via INTRAVENOUS

## 2019-07-03 MED ORDER — PREDNISONE 20 MG PO TABS: 60.0000 mg | ORAL_TABLET | Freq: Every day | ORAL | 0 refills | Status: AC

## 2019-07-03 MED ORDER — EPINEPHRINE 0.3 MG/0.3ML IJ SOAJ: 0.3000 mg | Freq: Once | INTRAMUSCULAR | Status: DC

## 2019-07-03 MED ORDER — FAMOTIDINE IN NACL 20-0.9 MG/50ML-% IV SOLN
20.0000 mg | Freq: Once | INTRAVENOUS | Status: AC
  Administered 2019-07-03: 20 mg via INTRAVENOUS

## 2019-07-03 NOTE — ED Notes (Signed)
Pt verbalized understanding of discharge instructions. NAD at this time. 

## 2019-07-03 NOTE — ED Provider Notes (Signed)
On reassessment she is in no distress and feels completely better.  She is cleared for outpatient follow-up.   Emily Filbert, MD 07/03/19 [phone]

## 2019-07-03 NOTE — ED Triage Notes (Signed)
Patient states that she developed hives last night and took some benadryl. Patient states that when she woke up about 30 minutes ago she has tingling to her lips and tongue. Patient with complaint of difficulty swallowing.

## 2019-07-03 NOTE — ED Provider Notes (Signed)
[facility] Emergency Department Provider Note  ____________________________________________   First MD Initiated Contact with Patient 07/03/19 [phone]     (approximate)  I have reviewed the triage vital signs and the nursing notes.   HISTORY  Chief Complaint Allergic Reaction    HPI Jacqueline Small is a 24 y.o. female with previous history of allergic reaction with allergy testing performed which was positive for environmental allergens such as pollen presents emergency department secondary to acute onset of hives over generalized including the neck last night patient states that she took some Benadryl at that time however on awakening this morning patient noted hives on her neck with difficulty swallowing tight sensation in her throat.  Patient took an additional 2 Benadryl before arrival to the emergency department hives have improved however her throat tightness has persisted.  Patient denies any abdominal pain no vomiting.  Patient denies any dizziness or lightheadedness.        Past Medical History:  Diagnosis Date  . Ovarian cyst     There are no problems to display for this patient.   Past Surgical History:  Procedure Laterality Date  . KNEE SURGERY Right     Prior to Admission medications   Medication Sig Start Date End Date Taking? Authorizing Provider  albuterol (PROVENTIL HFA;VENTOLIN HFA) 108 (90 Base) MCG/ACT inhaler Inhale 2 puffs every 6 (six) hours as needed into the lungs for wheezing or shortness of breath. 01/28/17   Nita Sickle, MD  azithromycin [facility]) 250 MG tablet Take 1 a day for 4 days 01/28/17   Don Perking, [place], MD  dicyclomine (BENTYL) 20 MG tablet Take 1 tablet (20 mg total) by mouth 3 (three) times daily as needed for spasms. 02/09/17 02/09/18  Minna Antis, MD  metroNIDAZOLE (FLAGYL) 500 MG tablet Take 1 tablet (500 mg total) 2 (two) times daily by mouth. 01/24/17   Tommi Rumps, PA-C     Allergies Patient has no known allergies.  No family history on file.  Social History Social History   Tobacco Use  . Smoking status: Never Smoker  . Smokeless tobacco: Never Used  Substance Use Topics  . Alcohol use: Yes    Comment: occ  . Drug use: Not Currently    Types: Marijuana    Review of Systems Constitutional: No fever/chills Eyes: No visual changes. ENT: No sore throat. Cardiovascular: Denies chest pain. Respiratory: Denies shortness of breath. Gastrointestinal: No abdominal pain.  No nausea, no vomiting.  No diarrhea.  No constipation. Genitourinary: Negative for dysuria. Musculoskeletal: Negative for neck pain.  Negative for back pain. Integumentary: Positive for hives Neurological: Negative for headaches, focal weakness or numbness.   ____________________________________________   PHYSICAL EXAM:  VITAL SIGNS: ED Triage Vitals  Enc Vitals Group     BP 07/03/19 0508 (!) 132/96     Pulse Rate 07/03/19 0508 94     Resp 07/03/19 0508 18     Temp 07/03/19 0508 98.1 F (36.7 C)     Temp Source 07/03/19 0508 Oral     SpO2 07/03/19 0508 100 %     Weight 07/03/19 0504 65.8 kg (145 lb)     Height 07/03/19 0504 1.727 m (5\' 8" )     Head Circumference --      Peak Flow --      Pain Score 07/03/19 0504 0     Pain Loc --      Pain Edu? --      Excl. in GC? --  Constitutional: Alert and oriented.  Eyes: Conjunctivae are normal.  Mouth/Throat: No oral or posterior oropharyngeal swelling noted.  Patient repetitively clearing her throat. Neck: No stridor.  No meningeal signs.   Cardiovascular: Normal rate, regular rhythm. Good peripheral circulation. Grossly normal heart sounds. Respiratory: Normal respiratory effort.  No retractions. Gastrointestinal: Soft and nontender. No distention.  Musculoskeletal: No lower extremity tenderness nor edema. No gross deformities of extremities. Neurologic:  Normal speech and language. No gross focal neurologic  deficits are appreciated.  Skin:  Skin is warm, dry and intact.  Positive for hives Psychiatric: Mood and affect are normal. Speech and behavior are normal.     Procedures   ____________________________________________   INITIAL IMPRESSION / MDM / ASSESSMENT AND PLAN / ED COURSE  As part of my medical decision making, I reviewed the following data within the electronic MEDICAL RECORD NUMBER  24 year old female presented with above-stated history and physical exam consistent with allergic reaction with concern for possible impending anaphylaxis.  Patient given epinephrine 0.3 mg IM.  Patient also given Solu-Medrol 125 mg Benadryl 25 mg and Pepcid 20 mg IV.  On reevaluation symptoms resolved including throat tightness.  We will observe the patient for 4 hours in the emergency department discharge patient home with prednisone and EpiPen      ____________________________________________  FINAL CLINICAL IMPRESSION(S) / ED DIAGNOSES  Final diagnoses:  Anaphylaxis, initial encounter     MEDICATIONS GIVEN DURING THIS VISIT:  Medications  EPINEPHrine (EPI-PEN) injection 0.3 mg (0.3 mg Intramuscular Given 07/03/19 0519)  methylPREDNISolone sodium succinate (SOLU-MEDROL) 125 mg/2 mL injection 125 mg (125 mg Intravenous Given 07/03/19 0529)  diphenhydrAMINE (BENADRYL) injection 25 mg (25 mg Intravenous Given 07/03/19 0527)  famotidine (PEPCID) IVPB 20 mg premix (0 mg Intravenous Stopped 07/03/19 0600)     ED Discharge Orders    None      *Please note:  Jacqueline Small was evaluated in Emergency Department on 07/03/2019 for the symptoms described in the history of present illness. She was evaluated in the context of the global COVID-19 pandemic, which necessitated consideration that the patient might be at risk for infection with the SARS-CoV-2 virus that causes COVID-19. Institutional protocols and algorithms that pertain to the evaluation of patients at risk for COVID-19 are in a state of  rapid change based on information released by regulatory bodies including the CDC and federal and state organizations. These policies and algorithms were followed during the patient's care in the ED.  Some ED evaluations and interventions may be delayed as a result of limited staffing during the pandemic.*  Note:  This document was prepared using Dragon voice recognition software and may include unintentional dictation errors.   Gregor Hams, MD 07/03/19 [phone]

## 2020-06-25 ENCOUNTER — Encounter: Payer: Self-pay | Admitting: *Deleted

## 2020-06-25 ENCOUNTER — Encounter: Payer: Self-pay | Admitting: Gastroenterology
